# Patient Record
Sex: Male | Born: 1985 | Hispanic: No | Marital: Single | Smoking: Never smoker
Health system: Southern US, Community
[De-identification: ages and names within clinical notes are randomized; demographics above are authoritative.]

---

## 2015-06-19 ENCOUNTER — Emergency Department (HOSPITAL_COMMUNITY): Payer: PPO

## 2015-06-19 ENCOUNTER — Encounter (HOSPITAL_COMMUNITY): Payer: Self-pay | Admitting: Emergency Medicine

## 2015-06-19 ENCOUNTER — Emergency Department (HOSPITAL_COMMUNITY)
Admission: EM | Admit: 2015-06-19 | Discharge: 2015-06-19 | Disposition: A | Discharge status: Home / Self Care | Payer: PPO | Attending: Emergency Medicine | Admitting: Emergency Medicine

## 2015-06-19 DIAGNOSIS — J189 Pneumonia, unspecified organism: Secondary | ICD-10-CM

## 2015-06-19 DIAGNOSIS — R05 Cough: Secondary | ICD-10-CM | POA: Diagnosis present

## 2015-06-19 DIAGNOSIS — J159 Unspecified bacterial pneumonia: Secondary | ICD-10-CM | POA: Insufficient documentation

## 2015-06-19 MED ORDER — AZITHROMYCIN 250 MG PO TABS: 250.0000 mg | ORAL_TABLET | Freq: Every day | ORAL | Status: DC

## 2015-06-19 MED ORDER — ACETAMINOPHEN 325 MG PO TABS
650.0000 mg | ORAL_TABLET | Freq: Once | ORAL | Status: AC
  Administered 2015-06-19: 650 mg via ORAL
  Filled 2015-06-19: qty 2

## 2015-06-19 NOTE — Discharge Instructions (Signed)

## 2015-06-19 NOTE — ED Provider Notes (Signed)
CSN: [id number]     Arrival date & time 06/19/15  1134 History  By signing my name below, I, Marica Otterusrat Rahman, attest that this documentation has been prepared under the direction and in the presence of Teressa LowerVrinda Kasmira Cacioppo, NP. Electronically Signed: Marica OtterNusrat Rahman, ED Scribe. 06/19/2015. 12:12 PM.   Chief Complaint  Patient presents with  . Cough   The history is provided by the patient. No language interpreter was used.   PCP: No primary care provider on file. HPI Comments: Dominik Gerlene Feelbarakati is a 29 y.o. male who presents to the Emergency Department complaining of productive cough with green yellow sputum onset three days ago. Pt reports trying OTC meds without relief. Pt also reports starting anitibiotics two days ago-- pt reports he had the antibiotics left over from a previous illness two years ago. Pt denies n/v/d or fevers at home-- at triage pt has a temperature of 101. Pt denies any chronic health conditions, tobacco use, Hx of asthma.    History reviewed. No pertinent past medical history. History reviewed. No pertinent past surgical history. No family history on file. Social History  Substance Use Topics  . Smoking status: Never Smoker   . Smokeless tobacco: None  . Alcohol Use: No    Review of Systems  Constitutional: Negative for fever (No fever at home but temp of 101 at triage ).  Respiratory: Positive for cough.   Gastrointestinal: Negative for nausea, vomiting and diarrhea.  All other systems reviewed and are negative.  Allergies  Review of patient's allergies indicates no known allergies.  Home Medications   Prior to Admission medications   Not on File   Triage Vitals: BP 138/70 mmHg  Pulse 114  Temp(Src) 101 F (38.3 C) (Oral)  Resp 19  SpO2 99% Physical Exam  Constitutional: He is oriented to person, place, and time. He appears well-developed and well-nourished.  HENT:  Head: Normocephalic and atraumatic.  Right Ear: External ear normal.  Left Ear:  External ear normal.  Eyes: Conjunctivae are normal.  Neck: Normal range of motion.  Pulmonary/Chest: Effort normal. He has rales.  Abdominal: He exhibits no distension.  Musculoskeletal: Normal range of motion.  Neurological: He is alert and oriented to person, place, and time.  Skin: Skin is warm and dry.  Psychiatric: He has a normal mood and affect.  Nursing note and vitals reviewed.   ED Course  Procedures (including critical care time) DIAGNOSTIC STUDIES: Oxygen Saturation is 99% on ra, nl by my interpretation.    COORDINATION OF CARE: 12:08 PM: Discussed treatment plan which includes chest xray and meds with pt at bedside; patient verbalizes understanding and agrees with treatment plan.  Imaging Review Dg Chest 2 View  06/19/2015  CLINICAL DATA:  Productive cough and congestion for 3 days. EXAM: CHEST  2 VIEW COMPARISON:  None. FINDINGS: Focal small airspace opacity is seen at the medial left lung base, lingula, suspicious for pneumonia. Lungs otherwise clear. Lung volumes are normal. Heart size is normal. Overall cardiomediastinal silhouette is normal in size and configuration. Osseous and soft tissue structures about the chest are unremarkable. IMPRESSION: Probable lingular pneumonia, medial left lung base. Electronically Signed   By: Bary RichardStan  Maynard M.D.   On: 06/19/2015 13:07   I have personally reviewed and evaluated these images as part of my medical decision-making.   MDM   Final diagnoses:  CAP (community acquired pneumonia)    Pt vitals stable:pt is okay to go home. Will give zithromax. Pt given return precautions  I personally performed the services described in this documentation, which was scribed in my presence. The recorded information has been reviewed and is accurate.    Teressa Lower, NP 06/19/15 1325  Laurence Spates, MD 06/20/15 [phone]

## 2015-06-19 NOTE — ED Notes (Signed)
Pt from home for productive cough x3 days, states he has tried OTC meds and has taken augmentin that he had left over from previous symptoms. States mucus has been green yellow. Airway intact, nad noted. Denies any  N/v/d or fevers.

## 2015-06-19 NOTE — ED Notes (Signed)
Please see provider note for assessment.  

## 2015-06-21 ENCOUNTER — Emergency Department (HOSPITAL_COMMUNITY)
Admission: EM | Admit: 2015-06-21 | Discharge: 2015-06-21 | Disposition: A | Discharge status: Home / Self Care | Payer: PPO | Attending: Emergency Medicine | Admitting: Emergency Medicine

## 2015-06-21 ENCOUNTER — Encounter (HOSPITAL_COMMUNITY): Payer: Self-pay | Admitting: *Deleted

## 2015-06-21 DIAGNOSIS — R509 Fever, unspecified: Secondary | ICD-10-CM | POA: Insufficient documentation

## 2015-06-21 DIAGNOSIS — Z792 Long term (current) use of antibiotics: Secondary | ICD-10-CM | POA: Diagnosis not present

## 2015-06-21 DIAGNOSIS — R042 Hemoptysis: Secondary | ICD-10-CM | POA: Diagnosis not present

## 2015-06-21 DIAGNOSIS — R000 Tachycardia, unspecified: Secondary | ICD-10-CM | POA: Insufficient documentation

## 2015-06-21 DIAGNOSIS — R05 Cough: Secondary | ICD-10-CM | POA: Diagnosis present

## 2015-06-21 NOTE — ED Notes (Signed)
NAD at d/c, pt alert x4.  

## 2015-06-21 NOTE — Discharge Instructions (Signed)
Hemoptysis Hemoptysis means coughing up blood. We believe you coughed up blood because you had a small vessel in your nose that bled. You likely swallowed this blood without ever realizing your nose was bleeding. As the blood dripped down the back of your throat you coughed some of this up.  CAUSES  The most common cause of hemoptysis is bronchitis. Some other common causes include:   Nose bleed  A ruptured blood vessel caused by coughing or an infection.   A medical condition that causes damage to the large air passageways (bronchiectasis).   A blood clot in the lungs (pulmonary embolism).   Pneumonia.   Tuberculosis.   Breathing in a small foreign object.   Cancer. For some people with hemoptysis, no definite cause is ever identified.  HOME CARE INSTRUCTIONS  Use nasal saline to keep your nose moist to prevent nose bleeds.   Finish your Antibiotics even if you start to feel better.  Do not smoke. Also avoid secondhand smoke.  Follow up with your caregiver as directed. SEEK IMMEDIATE MEDICAL CARE IF:   You cough up bloody mucus for longer than a week.  You have a blood-producing cough that is severe or getting worse.  You have a blood-producing cough thatcomes and goes over time.  You develop problems with your breathing.   You vomit blood.  You develop bloody or black-colored stools.  You have chest pain.   You develop night sweats.  You feel faint or pass out.   You have a fever or persistent symptoms for more than 2-3 days.  You have a fever and your symptoms suddenly get worse. MAKE SURE YOU:  Understand these instructions.  Will watch your condition.  Will get help right away if you are not doing well or get worse.   This information is not intended to replace advice given to you by your health care provider. Make sure you discuss any questions you have with your health care provider.   Document Released: 08/15/2001 Document Revised:  05/23/2012 Document Reviewed: 03/23/2012 Elsevier Interactive Patient Education [employer]2016 [employer]

## 2015-06-21 NOTE — ED Provider Notes (Signed)
CSN: [id number]     Arrival date & time 06/21/15  1342 History   First MD Initiated Contact with Patient 06/21/15 1516     Chief Complaint  Patient presents with  . Cough   30 yo M w/recent diagnosis of CAP who presents w/1 episode of hemoptysis. Pt endorses he's had a productive cough for 3-4 days and was given a Zpack 2 days ago. Feels his cough is improving but became worried today when he coughed up bright red blood. This only happened once and was not painful. He denies CP, SOB, fever, chills, diarrhea, constipation, hematemesis, dysuria, hematuria, sick contacts, leg pain, leg swelling, hx of DVT, or recent travel.   (Consider location/radiation/quality/duration/timing/severity/associated sxs/prior Treatment) Patient is a 30 y.o. male presenting with cough.  Cough Cough characteristics:  Productive Severity:  Moderate Onset quality:  Sudden Duration:  4 days Timing:  Constant Progression:  Partially resolved Chronicity:  New Relieved by:  Nothing Worsened by:  Nothing tried Associated symptoms: fever   Associated symptoms: no chest pain, no chills, no headaches, no rhinorrhea, no shortness of breath and no sore throat   Associated symptoms comment:  Hemoptysis   History reviewed. No pertinent past medical history. History reviewed. No pertinent past surgical history. History reviewed. No pertinent family history. Social History  Substance Use Topics  . Smoking status: Never Smoker   . Smokeless tobacco: None  . Alcohol Use: No    Review of Systems  Constitutional: Positive for fever. Negative for chills.  HENT: Negative for rhinorrhea, sinus pressure, sore throat and trouble swallowing.   Respiratory: Positive for cough. Negative for shortness of breath.   Cardiovascular: Negative for chest pain, palpitations and leg swelling.  Gastrointestinal: Negative for nausea, vomiting, abdominal pain, diarrhea, constipation, blood in stool, abdominal distention, anal bleeding and  rectal pain.  Genitourinary: Negative for dysuria, frequency, flank pain and decreased urine volume.  Neurological: Negative for dizziness, speech difficulty, light-headedness and headaches.  All other systems reviewed and are negative.     Allergies  Review of patient's allergies indicates no known allergies.  Home Medications   Prior to Admission medications   Medication Sig Start Date End Date Taking? Authorizing Provider  azithromycin (ZITHROMAX) 250 MG tablet Take 1 tablet (250 mg total) by mouth daily. Take first 2 tablets together, then 1 every day until finished. 06/19/15   Teressa LowerVrinda Pickering, NP   BP 113/57 mmHg  Pulse 80  Temp(Src) 98.5 F (36.9 C) (Oral)  Resp 18  Ht 5\' 8"  (1.727 m)  Wt 57.335 kg  BMI 19.22 kg/m2  SpO2 97% Physical Exam  Constitutional: He is oriented to person, place, and time. He appears well-developed and well-nourished. No distress.  HENT:  Head: Normocephalic and atraumatic.  Mouth/Throat: Oropharynx is clear and moist.  No blood in oropharynx. 1 small bloody vessel in left anterior nare.   Eyes: Pupils are equal, round, and reactive to light.  Neck: Normal range of motion.  Cardiovascular: Normal rate, regular rhythm, normal heart sounds and intact distal pulses.  Exam reveals no gallop and no friction rub.   No murmur heard. Pulmonary/Chest: Effort normal and breath sounds normal. No respiratory distress. He has no wheezes. He has no rales. He exhibits no tenderness.  Abdominal: Soft. Bowel sounds are normal. He exhibits no distension and no mass. There is no tenderness. There is no rebound and no guarding.  Musculoskeletal: Normal range of motion.  Lymphadenopathy:    He has no cervical adenopathy.  Neurological: He  is alert and oriented to person, place, and time. No cranial nerve deficit. Coordination normal.  Skin: Skin is warm and dry. No rash noted. He is not diaphoretic.  Nursing note and vitals reviewed.   ED Course  Procedures  (including critical care time) Labs Review Labs Reviewed - No data to display  Imaging Review No results found. I have personally reviewed and evaluated these images and lab results as part of my medical decision-making.   EKG Interpretation None      MDM   Final diagnoses:  Hemoptysis   30 yo M w/CAP who presents w/1 episode of hemoptysis. See HPI for details. On exam, NAD, AFVSS aside from minor tachycardia. Likely 2/2 to cough and small vessel bleed in nose. Not c/w PE (no DVT sx, no hypoxia, no SOB, no CP.) Monitored for short period with no recurrent episodes and vitals stable. Therefore, stable for DC w/FU to PCP. Strict return if CP, SOB, or severe bleed.   Pt was seen under the supervision of Dr. Rhunette Croft.     Rachelle Hora, MD 06/21/15 1607  Derwood Kaplan, MD 06/22/15 2256

## 2015-06-21 NOTE — ED Notes (Signed)
Pt reports being here two days ago for cough and cold symptoms, was given prescriptions with no relief. Now having productive cough with blood in sputum. Airway intact, no resp distress noted and pt denies fever.

## 2017-06-07 ENCOUNTER — Encounter (HOSPITAL_COMMUNITY): Payer: Self-pay | Admitting: Emergency Medicine

## 2017-06-07 DIAGNOSIS — N50812 Left testicular pain: Secondary | ICD-10-CM | POA: Insufficient documentation

## 2017-06-07 DIAGNOSIS — N5089 Other specified disorders of the male genital organs: Secondary | ICD-10-CM | POA: Insufficient documentation

## 2017-06-07 NOTE — ED Triage Notes (Signed)
Patient c/o swelling to left testicle x4 days. Denies pain and trauma/injury.

## 2017-06-08 ENCOUNTER — Emergency Department (HOSPITAL_COMMUNITY)
Admission: EM | Admit: 2017-06-08 | Discharge: 2017-06-08 | Disposition: A | Discharge status: Home / Self Care | Payer: PPO | Attending: Emergency Medicine | Admitting: Emergency Medicine

## 2017-06-08 ENCOUNTER — Emergency Department (HOSPITAL_COMMUNITY): Payer: PPO

## 2017-06-08 DIAGNOSIS — N5089 Other specified disorders of the male genital organs: Secondary | ICD-10-CM

## 2017-06-08 DIAGNOSIS — N50819 Testicular pain, unspecified: Secondary | ICD-10-CM

## 2017-06-08 LAB — URINALYSIS, ROUTINE W REFLEX MICROSCOPIC
Bilirubin Urine: NEGATIVE
Glucose, UA: NEGATIVE mg/dL
Hgb urine dipstick: NEGATIVE
Ketones, ur: 20 mg/dL — AB
LEUKOCYTES UA: NEGATIVE
Nitrite: NEGATIVE
PROTEIN: NEGATIVE mg/dL
SPECIFIC GRAVITY, URINE: 1.012 (ref 1.005–1.030)
pH: 7 (ref 5.0–8.0)

## 2017-06-08 LAB — GC/CHLAMYDIA PROBE AMP (~~LOC~~) NOT AT ARMC
CHLAMYDIA, DNA PROBE: NEGATIVE
NEISSERIA GONORRHEA: NEGATIVE

## 2017-06-08 NOTE — ED Provider Notes (Signed)
[facility]-EMERGENCY DEPT Provider Note: Lowella DellJ. Lane Telissa Palmisano, MD, FACEP  CSN: [id number] MRN: [id number] ARRIVAL: 06/07/17 at 2042 ROOM: [facility]   CHIEF COMPLAINT  Groin Swelling   HISTORY OF PRESENT ILLNESS  06/08/17 2:49 AM Randall Oconnell is a 31 y.o. male with a 4-day history of perceived swelling in his left testicle.  He denies pain in the left testicle but is experienced some "warmth" when he walks.  Symptoms are mild.  He denies fever, chills, nausea, vomiting, diarrhea, dysuria, penile discharge or abdominal pain.   History reviewed. No pertinent past medical history.  History reviewed. No pertinent surgical history.  No family history on file.  Social History   Tobacco Use  . Smoking status: Never Smoker  Substance Use Topics  . Alcohol use: No  . Drug use: Not on file    Prior to Admission medications   Medication Sig Start Date End Date Taking? Authorizing Provider  acetaminophen (TYLENOL) 325 MG tablet Take 325 mg by mouth every 6 (six) hours as needed for moderate pain.   Yes [provider]    Allergies Patient has no known allergies.   REVIEW OF SYSTEMS  Negative except as noted here or in the History of Present Illness.   PHYSICAL EXAMINATION  Initial Vital Signs Blood pressure (!) 149/91, pulse (!) 101, temperature 99.3 F (37.4 C), temperature source Oral, resp. rate 14, height 5\' 5"  (1.651 m), weight 65 kg (143 lb 4.8 oz), SpO2 100 %.  Examination General: Well-developed, well-nourished male in no acute distress; appearance consistent with age of record HENT: normocephalic; atraumatic Eyes: pupils equal, round and reactive to light; extraocular muscles intact Neck: supple Heart: regular rate and rhythm Lungs: clear to auscultation bilaterally Abdomen: soft; nondistended; nontender; no masses or hepatosplenomegaly; bowel sounds present GU: Tanner V male, circumcised; no urethral discharge; testicles descended bilaterally without  significant mass or tenderness; no hernia palpated in supine and standing positions Extremities: No deformity; full range of motion; pulses normal Neurologic: Awake, alert and oriented; motor function intact in all extremities and symmetric; no facial droop Skin: Warm and dry Psychiatric: Normal mood and affect   RESULTS  Summary of this visit's results, reviewed by myself:   EKG Interpretation  Date/Time:    Ventricular Rate:    PR Interval:    QRS Duration:   QT Interval:    QTC Calculation:   R Axis:     Text Interpretation:        Laboratory Studies: Results for orders placed or performed during the hospital encounter of 06/08/17 (from the past 24 hour(s))  Urinalysis, Routine w reflex microscopic     Status: Abnormal   Collection Time: 06/08/17  2:59 AM  Result Value Ref Range   Color, Urine YELLOW YELLOW   APPearance CLEAR CLEAR   Specific Gravity, Urine 1.012 1.005 - 1.030   pH 7.0 5.0 - 8.0   Glucose, UA NEGATIVE NEGATIVE mg/dL   Hgb urine dipstick NEGATIVE NEGATIVE   Bilirubin Urine NEGATIVE NEGATIVE   Ketones, ur 20 (A) NEGATIVE mg/dL   Protein, ur NEGATIVE NEGATIVE mg/dL   Nitrite NEGATIVE NEGATIVE   Leukocytes, UA NEGATIVE NEGATIVE   Imaging Studies: [place] Scrotum  Result Date: 06/08/2017 CLINICAL DATA:  Left testicular swelling EXAM: SCROTAL ULTRASOUND DOPPLER ULTRASOUND OF THE TESTICLES TECHNIQUE: Complete ultrasound examination of the testicles, epididymis, and other scrotal structures was performed. Color and spectral Doppler ultrasound were also utilized to evaluate blood flow to the testicles. COMPARISON:  None. FINDINGS: Right testicle  Measurements: 3.7 x 2.1 x 2.8 cm. No mass or microlithiasis visualized. Left testicle Measurements: 4.0 x 2.2 x 2.5 cm. No mass or microlithiasis visualized. Right epididymis:  Normal in size and appearance. Left epididymis:  Normal in size and appearance. Hydrocele:  None visualized. Varicocele:  None visualized. Pulsed  Doppler interrogation of both testes demonstrates normal low resistance arterial and venous waveforms bilaterally. IMPRESSION: Normal scrotal ultrasound. No finding to explain the sensation of left testicle swelling. Electronically Signed   By: Deatra RobinsonKevin  Herman M.D.   On: 06/08/2017 04:05   [place] Pelvic Doppler (torsion R/o Or Mass Arterial Flow)  Result Date: 06/08/2017 CLINICAL DATA:  Left testicular swelling EXAM: SCROTAL ULTRASOUND DOPPLER ULTRASOUND OF THE TESTICLES TECHNIQUE: Complete ultrasound examination of the testicles, epididymis, and other scrotal structures was performed. Color and spectral Doppler ultrasound were also utilized to evaluate blood flow to the testicles. COMPARISON:  None. FINDINGS: Right testicle Measurements: 3.7 x 2.1 x 2.8 cm. No mass or microlithiasis visualized. Left testicle Measurements: 4.0 x 2.2 x 2.5 cm. No mass or microlithiasis visualized. Right epididymis:  Normal in size and appearance. Left epididymis:  Normal in size and appearance. Hydrocele:  None visualized. Varicocele:  None visualized. Pulsed Doppler interrogation of both testes demonstrates normal low resistance arterial and venous waveforms bilaterally. IMPRESSION: Normal scrotal ultrasound. No finding to explain the sensation of left testicle swelling. Electronically Signed   By: Deatra RobinsonKevin  Herman M.D.   On: 06/08/2017 04:05    ED COURSE  Nursing notes and initial vitals signs, including pulse oximetry, reviewed.  Vitals:   06/07/17 2138 06/08/17 0230  BP: (!) 152/89 (!) 149/91  Pulse: (!) 102 (!) 101  Resp: 12 14  Temp: 99.3 F (37.4 C)   TempSrc: Oral   SpO2: 100% 100%  Weight: 65 kg (143 lb 4.8 oz)   Height: 5\' 5"  (1.651 m)     PROCEDURES    ED DIAGNOSES     ICD-10-CM   1. Testicular discomfort N50.819   2. Testicular swelling, left N50.89 US PELVIC DOPPLER (TORSION R/O OR MASS ARTERIAL FLOW)    US PELVIC DOPPLER (TORSION R/O OR MASS ARTERIAL FLOW)       Kaylyne Axton, MD 06/08/17  [id number]

## 2019-06-18 IMAGING — US US SCROTUM
1 series · 14 of 25 positions shown · non-contrast
Comparison: None.

CLINICAL DATA: Left testicular swelling

EXAM:
SCROTAL ULTRASOUND
DOPPLER ULTRASOUND OF THE TESTICLES
TECHNIQUE: Complete ultrasound examination of the testicles, epididymis, and
other scrotal structures was performed. Color and spectral Doppler
ultrasound were also utilized to evaluate blood flow to the
testicles.

[Series 1: us scrotum · 0.06mm/px · 14 of 62 slices shown]
[im 1/62]
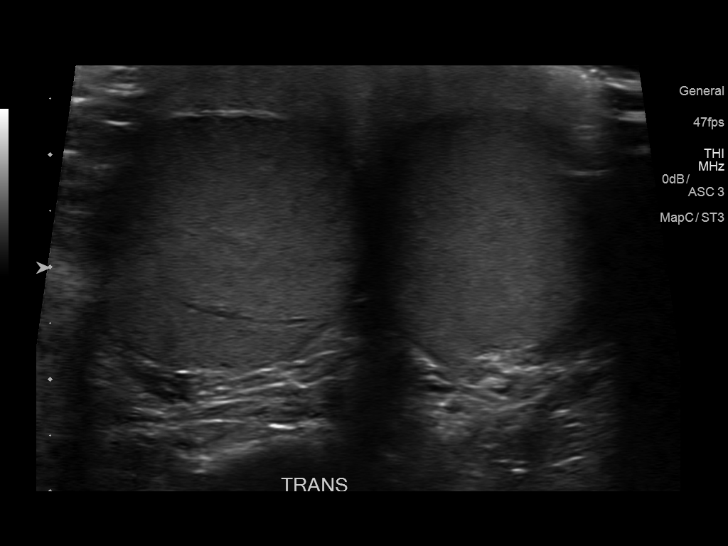
[im 6/62]
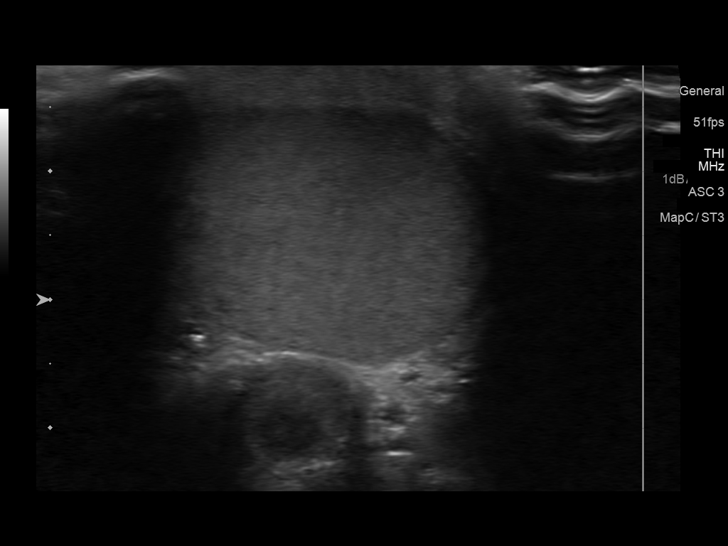
[im 11/62]
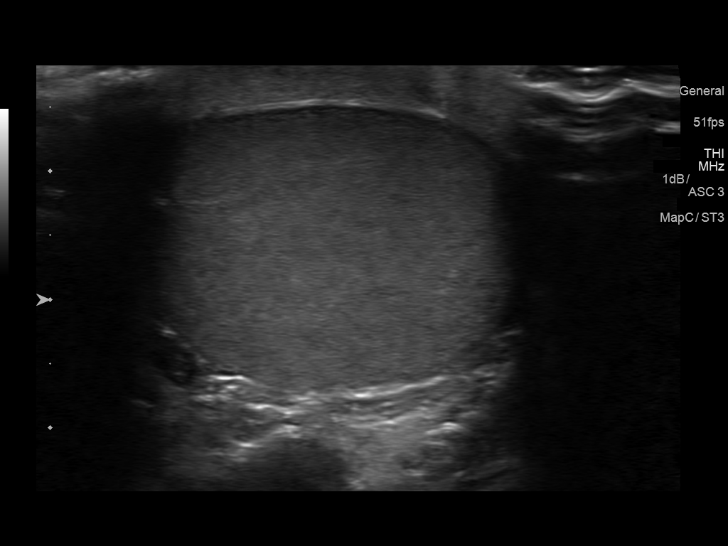
[im 16/62]
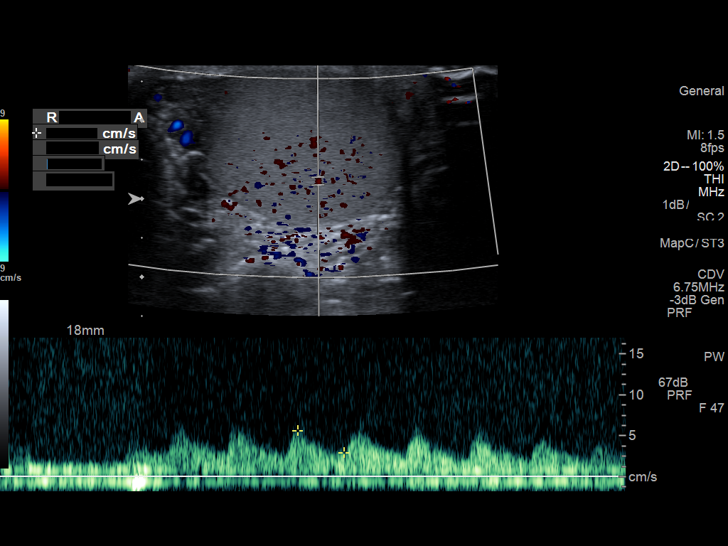
[im 21/62]
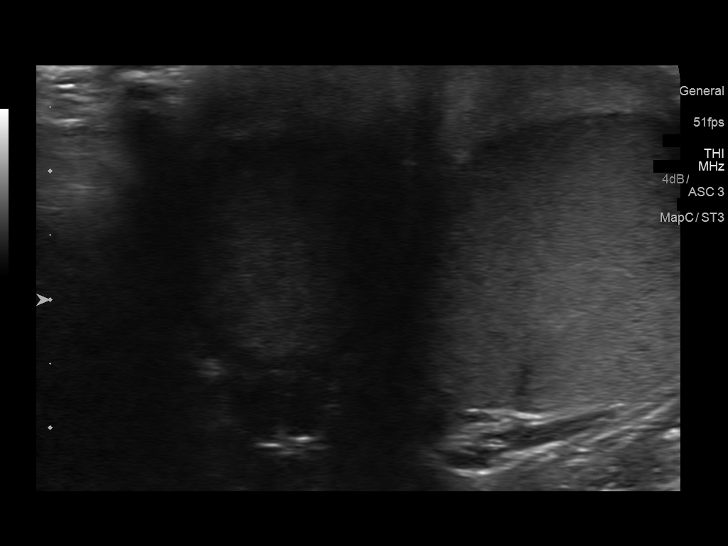
[im 23/62]
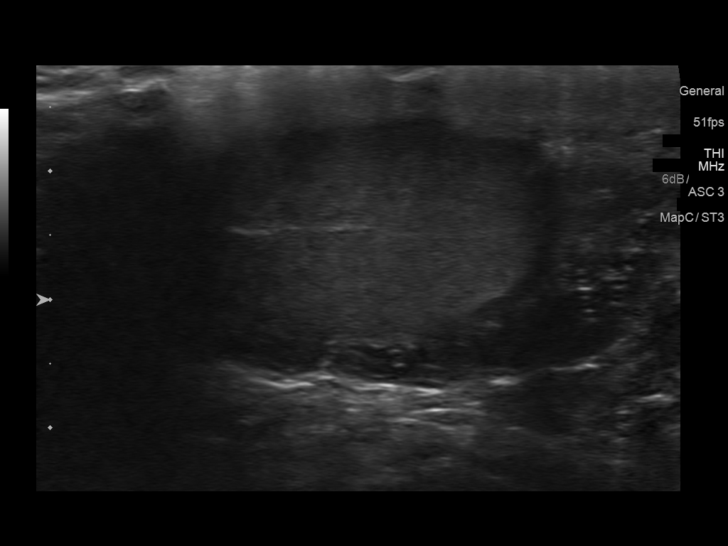
[im 28/62]
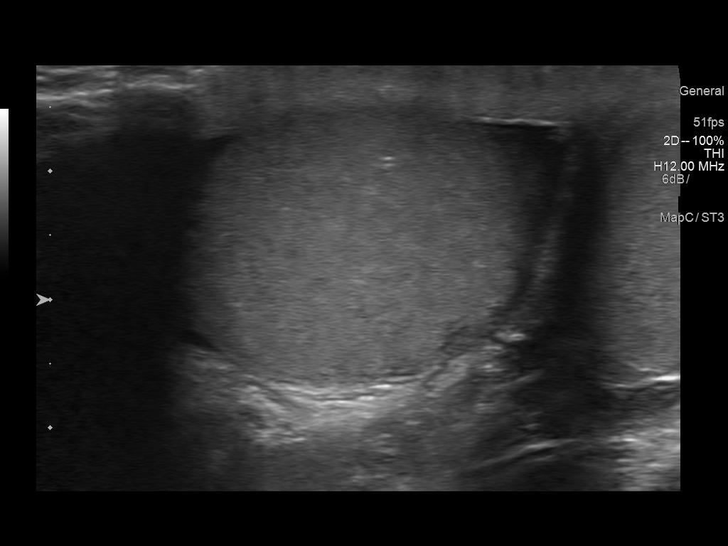
[im 34/62]
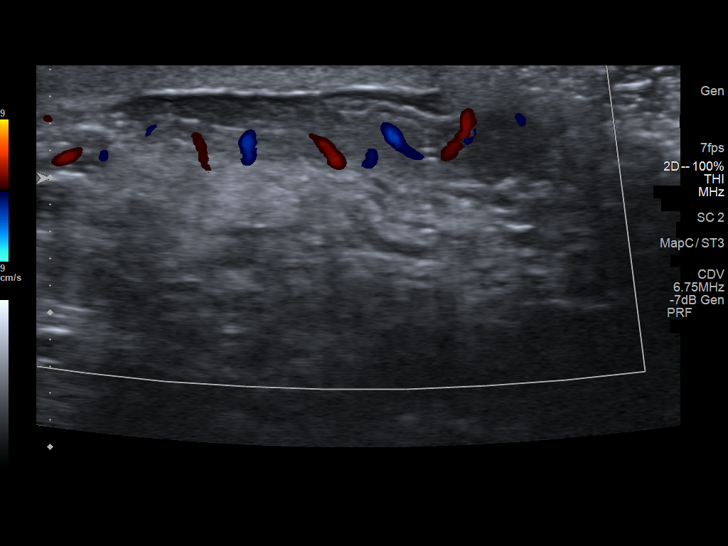
[im 39/62]
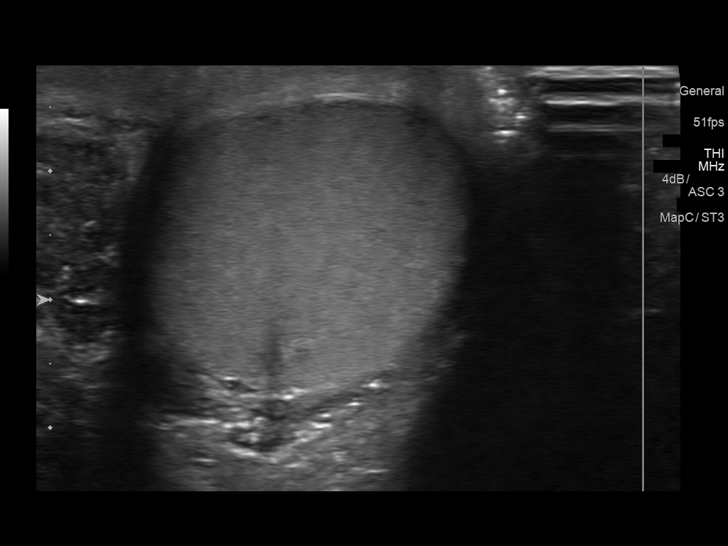
[im 41/62]
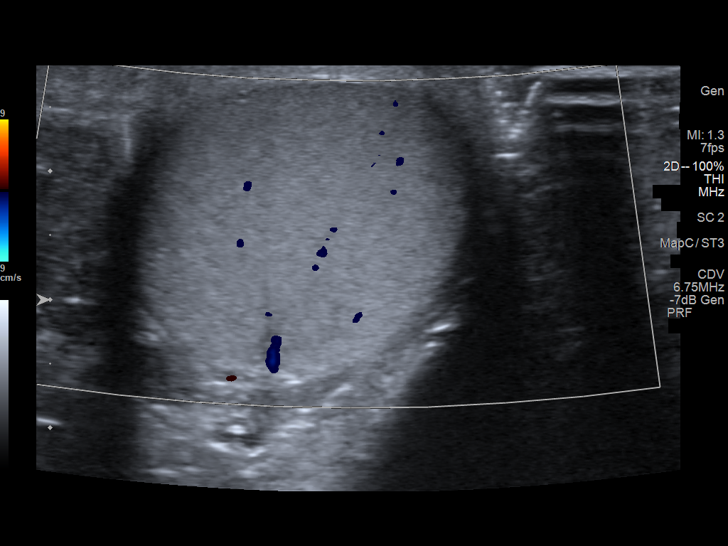
[im 46/62]
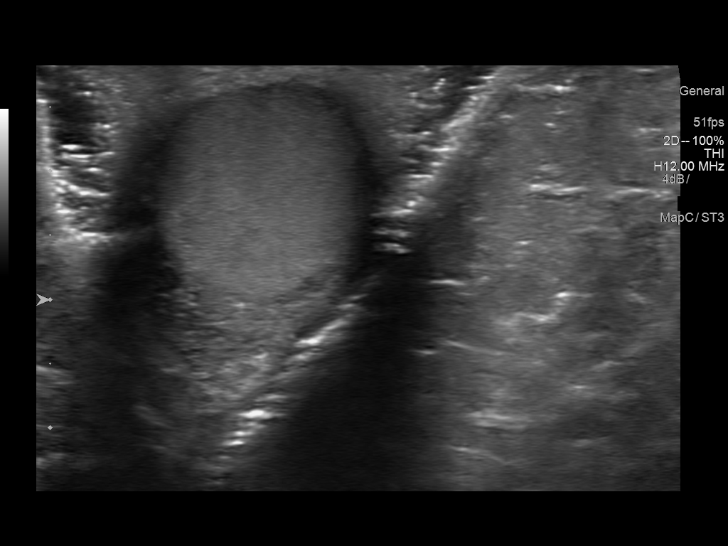
[im 51/62]
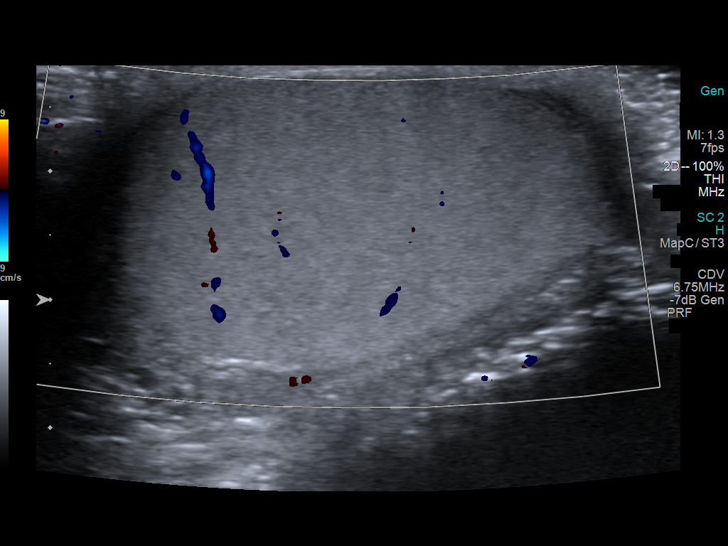
[im 56/62]
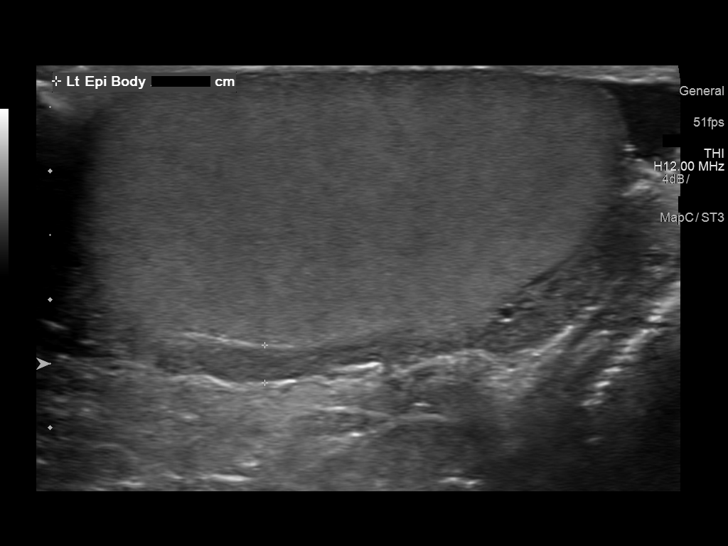
[im 62/62]
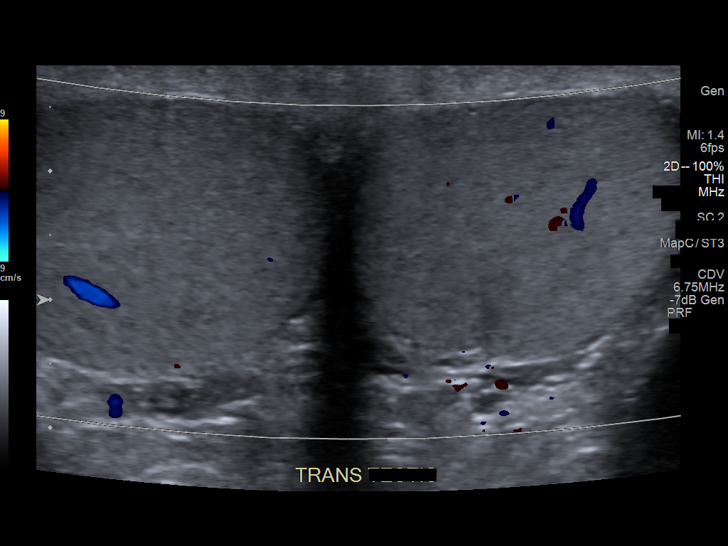

[14 of 25 positions shown; findings below may reference images not displayed]

FINDINGS: Right testicle

Measurements: 3.7 x 2.1 x 2.8 cm. No mass or microlithiasis
visualized.

Left testicle

Measurements: 4.0 x 2.2 x 2.5 cm. No mass or microlithiasis
visualized.

Right epididymis:  Normal in size and appearance.

Left epididymis:  Normal in size and appearance.

Hydrocele:  None visualized.

Varicocele:  None visualized.

Pulsed Doppler interrogation of both testes demonstrates normal low
resistance arterial and venous waveforms bilaterally.
IMPRESSION: Normal scrotal ultrasound. No finding to explain the sensation of
left testicle swelling.
